# Patient Record
Sex: Female | Born: 1960 | Race: White | Hispanic: No | Marital: Single | Smoking: Current every day smoker
Health system: Southern US, Community
[De-identification: ages and names within clinical notes are randomized; demographics above are authoritative.]

## PROBLEM LIST (undated history)

## (undated) DIAGNOSIS — I671 Cerebral aneurysm, nonruptured: Secondary | ICD-10-CM

## (undated) DIAGNOSIS — I219 Acute myocardial infarction, unspecified: Secondary | ICD-10-CM

## (undated) DIAGNOSIS — I10 Essential (primary) hypertension: Secondary | ICD-10-CM

## (undated) DIAGNOSIS — I2510 Atherosclerotic heart disease of native coronary artery without angina pectoris: Secondary | ICD-10-CM

---

## 2016-12-15 ENCOUNTER — Emergency Department (HOSPITAL_COMMUNITY): Payer: Medicaid Other

## 2016-12-15 ENCOUNTER — Encounter (HOSPITAL_COMMUNITY): Payer: Self-pay | Admitting: *Deleted

## 2016-12-15 ENCOUNTER — Emergency Department (HOSPITAL_COMMUNITY)
Admission: EM | Admit: 2016-12-15 | Discharge: 2016-12-15 | Disposition: A | Discharge status: Home / Self Care | Payer: Medicaid Other | Attending: Emergency Medicine | Admitting: Emergency Medicine

## 2016-12-15 DIAGNOSIS — Y733 Surgical instruments, materials and gastroenterology and urology devices (including sutures) associated with adverse incidents: Secondary | ICD-10-CM | POA: Insufficient documentation

## 2016-12-15 DIAGNOSIS — T83510A Infection and inflammatory reaction due to cystostomy catheter, initial encounter: Secondary | ICD-10-CM | POA: Diagnosis not present

## 2016-12-15 DIAGNOSIS — Z79899 Other long term (current) drug therapy: Secondary | ICD-10-CM | POA: Insufficient documentation

## 2016-12-15 DIAGNOSIS — N390 Urinary tract infection, site not specified: Secondary | ICD-10-CM

## 2016-12-15 DIAGNOSIS — R0789 Other chest pain: Secondary | ICD-10-CM | POA: Insufficient documentation

## 2016-12-15 DIAGNOSIS — R079 Chest pain, unspecified: Secondary | ICD-10-CM | POA: Diagnosis present

## 2016-12-15 DIAGNOSIS — F17200 Nicotine dependence, unspecified, uncomplicated: Secondary | ICD-10-CM | POA: Diagnosis not present

## 2016-12-15 DIAGNOSIS — Z7982 Long term (current) use of aspirin: Secondary | ICD-10-CM | POA: Diagnosis not present

## 2016-12-15 DIAGNOSIS — I252 Old myocardial infarction: Secondary | ICD-10-CM | POA: Diagnosis not present

## 2016-12-15 DIAGNOSIS — I2510 Atherosclerotic heart disease of native coronary artery without angina pectoris: Secondary | ICD-10-CM | POA: Diagnosis not present

## 2016-12-15 DIAGNOSIS — E0590 Thyrotoxicosis, unspecified without thyrotoxic crisis or storm: Secondary | ICD-10-CM

## 2016-12-15 DIAGNOSIS — G894 Chronic pain syndrome: Secondary | ICD-10-CM | POA: Diagnosis not present

## 2016-12-15 DIAGNOSIS — I10 Essential (primary) hypertension: Secondary | ICD-10-CM | POA: Insufficient documentation

## 2016-12-15 DIAGNOSIS — R109 Unspecified abdominal pain: Secondary | ICD-10-CM | POA: Diagnosis not present

## 2016-12-15 DIAGNOSIS — E039 Hypothyroidism, unspecified: Secondary | ICD-10-CM | POA: Diagnosis not present

## 2016-12-15 HISTORY — DX: Atherosclerotic heart disease of native coronary artery without angina pectoris: I25.10

## 2016-12-15 HISTORY — DX: Essential (primary) hypertension: I10

## 2016-12-15 HISTORY — DX: Cerebral aneurysm, nonruptured: I67.1

## 2016-12-15 HISTORY — DX: Acute myocardial infarction, unspecified: I21.9

## 2016-12-15 LAB — URINALYSIS, ROUTINE W REFLEX MICROSCOPIC
Bilirubin Urine: NEGATIVE
Glucose, UA: NEGATIVE mg/dL
Ketones, ur: NEGATIVE mg/dL
Nitrite: POSITIVE — AB
SPECIFIC GRAVITY, URINE: 1.023 (ref 1.005–1.030)
Squamous Epithelial / LPF: NONE SEEN
pH: 8 (ref 5.0–8.0)

## 2016-12-15 LAB — CBC
HCT: 45.2 % (ref 36.0–46.0)
HEMOGLOBIN: 15.4 g/dL — AB (ref 12.0–15.0)
MCH: 31.8 pg (ref 26.0–34.0)
MCHC: 34.1 g/dL (ref 30.0–36.0)
MCV: 93.2 fL (ref 78.0–100.0)
PLATELETS: 218 10*3/uL (ref 150–400)
RBC: 4.85 MIL/uL (ref 3.87–5.11)
RDW: 13.6 % (ref 11.5–15.5)
WBC: 9.4 10*3/uL (ref 4.0–10.5)

## 2016-12-15 LAB — TROPONIN I: Troponin I: <0.03 ng/mL (ref <0.03)

## 2016-12-15 LAB — BASIC METABOLIC PANEL
ANION GAP: 11 (ref 5–15)
BUN: 8 mg/dL (ref 6–20)
CALCIUM: 9.3 mg/dL (ref 8.9–10.3)
CO2: 18 mmol/L — AB (ref 22–32)
CREATININE: 0.78 mg/dL (ref 0.44–1.00)
Chloride: 109 mmol/L (ref 101–111)
GFR calc Af Amer: >60 mL/min (ref >60)
GLUCOSE: 85 mg/dL (ref 65–99)
Potassium: 3.8 mmol/L (ref 3.5–5.1)
Sodium: 138 mmol/L (ref 135–145)

## 2016-12-15 LAB — HEPATIC FUNCTION PANEL
ALT: 12 U/L — ABNORMAL LOW (ref 14–54)
AST: 15 U/L (ref 15–41)
Albumin: 3.8 g/dL (ref 3.5–5.0)
Alkaline Phosphatase: 56 U/L (ref 38–126)
BILIRUBIN TOTAL: 0.5 mg/dL (ref 0.3–1.2)
Total Protein: 6.4 g/dL — ABNORMAL LOW (ref 6.5–8.1)

## 2016-12-15 LAB — I-STAT TROPONIN, ED: TROPONIN I, POC: 0.01 ng/mL (ref 0.00–0.08)

## 2016-12-15 LAB — LIPASE, BLOOD: Lipase: 16 U/L (ref 11–51)

## 2016-12-15 LAB — T4, FREE: Free T4: 1.52 ng/dL — ABNORMAL HIGH (ref 0.61–1.12)

## 2016-12-15 LAB — TSH: TSH: 0.029 u[IU]/mL — AB (ref 0.350–4.500)

## 2016-12-15 MED ORDER — SULFAMETHOXAZOLE-TRIMETHOPRIM 800-160 MG PO TABS: 1.0000 | ORAL_TABLET | Freq: Two times a day (BID) | ORAL | 0 refills | Status: AC

## 2016-12-15 MED ORDER — HYDROMORPHONE HCL 2 MG/ML IJ SOLN
1.0000 mg | Freq: Once | INTRAMUSCULAR | Status: AC
  Administered 2016-12-15: 1 mg via INTRAVENOUS
  Filled 2016-12-15: qty 1

## 2016-12-15 MED ORDER — IOPAMIDOL (ISOVUE-300) INJECTION 61%
INTRAVENOUS | Status: AC
  Administered 2016-12-15: 100 mL
  Filled 2016-12-15: qty 100

## 2016-12-15 MED ORDER — SODIUM CHLORIDE 0.9 % IV BOLUS (SEPSIS)
1000.0000 mL | Freq: Once | INTRAVENOUS | Status: AC
  Administered 2016-12-15: 1000 mL via INTRAVENOUS

## 2016-12-15 MED ORDER — MORPHINE SULFATE (PF) 4 MG/ML IV SOLN
4.0000 mg | Freq: Once | INTRAVENOUS | Status: AC
  Administered 2016-12-15: 4 mg via INTRAVENOUS
  Filled 2016-12-15: qty 1

## 2016-12-15 MED ORDER — LEVOTHYROXINE SODIUM 50 MCG PO TABS: 50.0000 ug | ORAL_TABLET | Freq: Every day | ORAL | 0 refills | Status: AC

## 2016-12-15 MED ORDER — HYDROMORPHONE HCL 2 MG/ML IJ SOLN
1.0000 mg | Freq: Once | INTRAMUSCULAR | Status: AC
  Administered 2016-12-15: 0.5 mg via INTRAVENOUS
  Filled 2016-12-15: qty 1

## 2016-12-15 MED ORDER — OXYCODONE-ACETAMINOPHEN 10-325 MG PO TABS: 1.0000 | ORAL_TABLET | ORAL | 0 refills | Status: AC | PRN

## 2016-12-15 NOTE — Discharge Instructions (Signed)
You are hyperthyroid. Decrease synthroid to 50 mcg daily. Recheck TSH with your doctor in a week.   You have a mild UTI and foley was replaced. Take bactrim twice daily for a week.   You have chronic pain. We have increased your percocet to 10/325 mg.   Follow up with Dr. Leanor KailBonsu.   Return to ER if you have severe chest pain, catheter not draining, severe abdominal pain, vomiting, fevers.

## 2016-12-15 NOTE — ED Notes (Signed)
Pt reports she has weight over the past month.

## 2016-12-15 NOTE — ED Provider Notes (Signed)
[facility]PT Provider Note   CSN: [id number] Arrival date & time: 12/15/16  1658     History   Chief Complaint Chief Complaint  Patient presents with  . Chest Pain    HPI Shannon Montes is a 56 y.o. female hx of brain aneurysm s/p craniotomy and stent, CAD s/p stents, HTN, suprapubic catheter (she had incontinence after aneursym rupture), Here presenting with chest pain, pain around the suprapubic catheter. Patient recently [place] here [place] town to be with her daughter. Patient states that her suprapubic catheter was changed about a week ago right before Christmas. He said that she was not given a leg bag so the catheter has been tugging so there is some blood in her urine for the last 3 days. Patient also noticed that for the last 3 days, she has left-sided chest pain that is constant. Denies any shortness of breath. She states that this is different than her previous heart attacks. Over the last month or so, patient has lost about 40 pounds. She states that she is still eating but has less appetite and has no vomiting. Denies any fevers or chills.      The history is provided by the patient.    Past Medical History:  Diagnosis Date  . Brain aneurysm   . Coronary artery disease   . Hypertension   . MI (myocardial infarction)     There are no active problems to display for this patient.   History reviewed. No pertinent surgical history.  OB History    No data available       Home Medications    Prior to Admission medications   Medication Sig Start Date End Date Taking? Authorizing Provider  albuterol (PROVENTIL) (2.5 MG/3ML) 0.083% nebulizer solution Take 2.5 mg by nebulization every 6 (six) hours as needed for wheezing or shortness of breath.   Yes Historical Provider, MD  albuterol-ipratropium (COMBIVENT) 18-103 MCG/ACT inhaler Inhale 1-2 puffs into the lungs every 4 (four) hours as needed for wheezing or shortness of breath.   Yes Historical Provider, MD    aspirin EC 81 MG tablet Take 81 mg by mouth daily.   Yes Historical Provider, MD  atorvastatin (LIPITOR) 80 MG tablet Take 80 mg by mouth at bedtime.   Yes Historical Provider, MD  clopidogrel (PLAVIX) 75 MG tablet Take 75 mg by mouth daily. 12/07/16  Yes Historical Provider, MD  cyanocobalamin (,VITAMIN B-12,) 1000 MCG/ML injection Inject 1 mL into the muscle every 14 (fourteen) days.  12/11/16  Yes Historical Provider, MD  escitalopram (LEXAPRO) 20 MG tablet Take 20 mg by mouth daily. 12/11/16  Yes Historical Provider, MD  levETIRAcetam (KEPPRA) 1000 MG tablet Take 1,000 mg by mouth 2 (two) times daily.   Yes Historical Provider, MD  levothyroxine (SYNTHROID, LEVOTHROID) 75 MCG tablet Take 75 mcg by mouth daily before breakfast.   Yes Historical Provider, MD  lisinopril (PRINIVIL,ZESTRIL) 10 MG tablet Take 10 mg by mouth daily. 12/07/16  Yes Historical Provider, MD  oxybutynin (DITROPAN) 5 MG tablet Take 5 mg by mouth 2 (two) times daily.   Yes Historical Provider, MD  oxyCODONE-acetaminophen (PERCOCET/ROXICET) 5-325 MG tablet Take 1 tablet by mouth every 8 (eight) hours as needed for pain. 12/07/16  Yes Historical Provider, MD  pantoprazole (PROTONIX) 40 MG tablet Take 40 mg by mouth daily.   Yes Historical Provider, MD  potassium chloride SA (K-DUR,KLOR-CON) 20 MEQ tablet Take 20 mEq by mouth daily.   Yes Historical Provider, MD  topiramate (TOPAMAX) 50 MG tablet Take 50 mg by mouth 2 (two) times daily. 12/11/16  Yes Historical Provider, MD  zolpidem (AMBIEN) 5 MG tablet Take 5 mg by mouth at bedtime as needed for sleep. 12/11/16  Yes Historical Provider, MD    Family History No family history on file.  Social History Social History  Substance Use Topics  . Smoking status: Current Every Day Smoker  . Smokeless tobacco: Never Used  . Alcohol use Yes     Allergies   Beta adrenergic blockers   Review of Systems Review of Systems  Cardiovascular: Positive for chest pain.   Genitourinary:       Blood in urine   All other systems reviewed and are negative.    Physical Exam Updated Vital Signs BP (!) 125/110 (BP Location: Right Arm)   Pulse 61   Temp 98.3 F (36.8 C) (Oral)   Resp 20   SpO2 97%   Physical Exam  Constitutional:  Chronically ill, previous R craniotomy with skin flap   HENT:  Head: Normocephalic.  Eyes: EOM are normal. Pupils are equal, round, and reactive to light.  Neck: Normal range of motion. Neck supple.  Cardiovascular: Normal rate, regular rhythm and normal heart sounds.   Pulmonary/Chest: Effort normal and breath sounds normal. No respiratory distress. She has no wheezes. She has no rales.  Mild reproducible L chest tenderness   Abdominal: Soft. Bowel sounds are normal.  + L sided tenderness. Suprapubic tube in place and is draining urine with minimal clots   Musculoskeletal: Normal range of motion.  Neurological: She is alert.  Skin: Skin is warm.  Psychiatric: She has a normal mood and affect.  Nursing note and vitals reviewed.    ED Treatments / Results  Labs (all labs ordered are listed, but only abnormal results are displayed) Labs Reviewed  BASIC METABOLIC PANEL - Abnormal; Notable for the following:       Result Value   CO2 18 (*)    All other components within normal limits  CBC - Abnormal; Notable for the following:    Hemoglobin 15.4 (*)    All other components within normal limits  URINE CULTURE  TROPONIN I  LIPASE, BLOOD  HEPATIC FUNCTION PANEL  TSH  URINALYSIS, ROUTINE W REFLEX MICROSCOPIC    EKG  EKG Interpretation  Date/Time:  Monday December 15 2016 17:21:42 EST Ventricular Rate:  64 PR Interval:  142 QRS Duration: 82 QT Interval:  418 QTC Calculation: 431 R Axis:   61 Text Interpretation:  Normal sinus rhythm Right atrial enlargement Possible Anterior infarct , age undetermined Abnormal ECG No previous ECGs available Confirmed by Anh Mangano  MD, Amrit Cress ([id number]) on 12/15/2016 6:05:00 PM        Radiology No results found.  Procedures SUPRAPUBIC TUBE PLACEMENT Date/Time: 12/15/2016 11:00 PM Performed by: Charlynne Pander Authorized by: Charlynne Pander   Consent:    Consent obtained:  Verbal   Consent given by:  Patient   Risks discussed:  Bleeding   Alternatives discussed:  No treatment Anesthesia (see MAR for exact dosages):    Anesthesia method:  None Procedure details:    Complexity:  Simple   Catheter type:  Foley   Catheter size:  20 Fr   Ultrasound guidance: no     Number of attempts:  1   Urine characteristics:  Blood-tinged Post-procedure details:    Patient tolerance of procedure:  Tolerated well, no immediate complications   (including critical care time)  Medications Ordered in ED Medications  sodium chloride 0.9 % bolus 1,000 mL (not administered)  morphine 4 MG/ML injection 4 mg (not administered)     Initial Impression / Assessment and Plan / ED Course  I have reviewed the triage vital signs and the nursing notes.  Pertinent labs & imaging results that were available during my care of the patient were reviewed by me and considered in my medical decision making (see chart for details).  Clinical Course     Shannon Montes is a 56 y.o. female here with chest pain, weight loss, suprapubic catheter with blood in it. Chest pain is reproducible but she has hx of CAD with stents so will get trop x 2. Weight loss can be from cancer vs poor appetite vs thyroid problems so will get CT ab/pel, TSH, labs. I will change out the suprapubic catheter as I think there are some blood inside the catheter from not being taped to her leg.   10:59 PM I replaced suprapubic tube. UA from old tube showed blood , ? Colonization vs UTI. Daughter states that the urine smells bad so will give bactrim empirically. Urine culture sent. CXR and CT ab/pel unremarkable. TSH 0.029 and T4 1.5. She is on synthroid and likely is too hyperthyroid causing her weight loss. Will  decrease synthroid to 50 mcg daily. Delta trop neg. Patient actually admits to chronic pain and has been prescribed morphine in the past and now on percocet but ran out since she moved here. Will prescribe percocet for her pain. She has no follow up so I called case management to set up follow up for her. Gave strict return precautions.    Final Clinical Impressions(s) / ED Diagnoses   Final diagnoses:  None    New Prescriptions New Prescriptions   No medications on file     Charlynne Panderavid Hsienta Coralyn Roselli, MD 12/15/16 2303

## 2016-12-15 NOTE — Care Management (Signed)
ED CM consulted concerning assisting patient and family with follow up. Patient has complex medical history and recently relocated to the area. Patient has [place] Medicaid. Patient given resource for [facility] MD in the area accepting new Medicaid patients.

## 2016-12-15 NOTE — ED Triage Notes (Signed)
The pt is c/o lt sided chest pain for 3 days with   Her heart fluttering with bleeding from her supra pubic catheter

## 2016-12-15 NOTE — ED Notes (Signed)
Called X-ray to let them know she was ready for transport

## 2016-12-17 LAB — URINE CULTURE

## 2017-01-29 ENCOUNTER — Emergency Department (HOSPITAL_COMMUNITY)
Admission: EM | Admit: 2017-01-29 | Discharge: 2017-01-30 | Disposition: A | Discharge status: Home / Self Care | Payer: Medicaid Other | Attending: Emergency Medicine | Admitting: Emergency Medicine

## 2017-01-29 ENCOUNTER — Emergency Department (HOSPITAL_COMMUNITY): Payer: Medicaid Other

## 2017-01-29 ENCOUNTER — Encounter (HOSPITAL_COMMUNITY): Payer: Self-pay

## 2017-01-29 DIAGNOSIS — F17200 Nicotine dependence, unspecified, uncomplicated: Secondary | ICD-10-CM | POA: Diagnosis not present

## 2017-01-29 DIAGNOSIS — Z7982 Long term (current) use of aspirin: Secondary | ICD-10-CM | POA: Insufficient documentation

## 2017-01-29 DIAGNOSIS — I10 Essential (primary) hypertension: Secondary | ICD-10-CM | POA: Insufficient documentation

## 2017-01-29 DIAGNOSIS — T83010A Breakdown (mechanical) of cystostomy catheter, initial encounter: Secondary | ICD-10-CM

## 2017-01-29 DIAGNOSIS — I252 Old myocardial infarction: Secondary | ICD-10-CM | POA: Diagnosis not present

## 2017-01-29 DIAGNOSIS — T83098A Other mechanical complication of other indwelling urethral catheter, initial encounter: Secondary | ICD-10-CM | POA: Diagnosis present

## 2017-01-29 DIAGNOSIS — Y731 Therapeutic (nonsurgical) and rehabilitative gastroenterology and urology devices associated with adverse incidents: Secondary | ICD-10-CM | POA: Diagnosis not present

## 2017-01-29 DIAGNOSIS — Z91040 Latex allergy status: Secondary | ICD-10-CM | POA: Insufficient documentation

## 2017-01-29 DIAGNOSIS — I2510 Atherosclerotic heart disease of native coronary artery without angina pectoris: Secondary | ICD-10-CM | POA: Insufficient documentation

## 2017-01-29 DIAGNOSIS — N3090 Cystitis, unspecified without hematuria: Secondary | ICD-10-CM

## 2017-01-29 LAB — CBC WITH DIFFERENTIAL/PLATELET
BASOS ABS: 0.1 10*3/uL (ref 0.0–0.1)
Basophils Relative: 1 %
EOS PCT: 2 %
Eosinophils Absolute: 0.3 10*3/uL (ref 0.0–0.7)
HEMATOCRIT: 42.1 % (ref 36.0–46.0)
Hemoglobin: 14.2 g/dL (ref 12.0–15.0)
LYMPHS PCT: 40 %
Lymphs Abs: 5.2 10*3/uL — ABNORMAL HIGH (ref 0.7–4.0)
MCH: 31.2 pg (ref 26.0–34.0)
MCHC: 33.7 g/dL (ref 30.0–36.0)
MCV: 92.5 fL (ref 78.0–100.0)
MONO ABS: 0.5 10*3/uL (ref 0.1–1.0)
MONOS PCT: 4 %
NEUTROS ABS: 7.1 10*3/uL (ref 1.7–7.7)
Neutrophils Relative %: 53 %
PLATELETS: 250 10*3/uL (ref 150–400)
RBC: 4.55 MIL/uL (ref 3.87–5.11)
RDW: 14.2 % (ref 11.5–15.5)
WBC: 13.2 10*3/uL — ABNORMAL HIGH (ref 4.0–10.5)

## 2017-01-29 LAB — COMPREHENSIVE METABOLIC PANEL
ALBUMIN: 3.8 g/dL (ref 3.5–5.0)
ALK PHOS: 61 U/L (ref 38–126)
ALT: 14 U/L (ref 14–54)
AST: 17 U/L (ref 15–41)
Anion gap: 12 (ref 5–15)
BILIRUBIN TOTAL: 0.6 mg/dL (ref 0.3–1.2)
BUN: 12 mg/dL (ref 6–20)
CALCIUM: 9 mg/dL (ref 8.9–10.3)
CO2: 17 mmol/L — AB (ref 22–32)
Chloride: 110 mmol/L (ref 101–111)
Creatinine, Ser: 0.86 mg/dL (ref 0.44–1.00)
GFR calc Af Amer: >60 mL/min (ref >60)
GFR calc non Af Amer: >60 mL/min (ref >60)
GLUCOSE: 106 mg/dL — AB (ref 65–99)
Potassium: 3.1 mmol/L — ABNORMAL LOW (ref 3.5–5.1)
SODIUM: 139 mmol/L (ref 135–145)
Total Protein: 6.8 g/dL (ref 6.5–8.1)

## 2017-01-29 LAB — I-STAT CG4 LACTIC ACID, ED
LACTIC ACID, VENOUS: 0.85 mmol/L (ref 0.5–1.9)
Lactic Acid, Venous: 1.69 mmol/L (ref 0.5–1.9)

## 2017-01-29 LAB — URINALYSIS, ROUTINE W REFLEX MICROSCOPIC
Bilirubin Urine: NEGATIVE
Glucose, UA: NEGATIVE mg/dL
Ketones, ur: NEGATIVE mg/dL
Nitrite: NEGATIVE
PROTEIN: 30 mg/dL — AB
SPECIFIC GRAVITY, URINE: 1.008 (ref 1.005–1.030)
pH: 6 (ref 5.0–8.0)

## 2017-01-29 LAB — LIPASE, BLOOD: LIPASE: 24 U/L (ref 11–51)

## 2017-01-29 MED ORDER — CIPROFLOXACIN HCL 500 MG PO TABS
500.0000 mg | ORAL_TABLET | Freq: Once | ORAL | Status: AC
  Administered 2017-01-30: 500 mg via ORAL
  Filled 2017-01-29: qty 1

## 2017-01-29 MED ORDER — HYDROMORPHONE HCL 2 MG/ML IJ SOLN
1.0000 mg | Freq: Once | INTRAMUSCULAR | Status: AC
  Administered 2017-01-29: 1 mg via INTRAVENOUS
  Filled 2017-01-29: qty 1

## 2017-01-29 MED ORDER — CIPROFLOXACIN HCL 500 MG PO TABS: 500.0000 mg | ORAL_TABLET | Freq: Two times a day (BID) | ORAL | 0 refills | Status: AC

## 2017-01-29 MED ORDER — OXYBUTYNIN CHLORIDE 5 MG PO TABS
5.0000 mg | ORAL_TABLET | Freq: Once | ORAL | Status: AC
  Administered 2017-01-29: 5 mg via ORAL
  Filled 2017-01-29: qty 1

## 2017-01-29 NOTE — ED Provider Notes (Signed)
[facility] Provider Note   CSN: [id number] Arrival date & time: 01/29/17  1813     History   Chief Complaint Chief Complaint  Patient presents with  . Other    clogged suprapubic catheter    HPI Shannon Montes is a 56 y.o. female. Patient presents due to a clogged suprapubic catheter. She's had this catheter placed for years. 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 She reports her supra pubic catheter stopped draining this morning. She's had this problem before. She has not had any fever or chills. She complains of mild pain around her catheter site. There is also some skin color changes at that area. She is not currently on antibiotics.  HPI  Past Medical History:  Diagnosis Date  . Brain aneurysm   . Coronary artery disease   . Hypertension   . MI (myocardial infarction)     There are no active problems to display for this patient.   History reviewed. No pertinent surgical history.  OB History    No data available       Home Medications    Prior to Admission medications   Medication Sig Start Date End Date Taking? Authorizing Provider  albuterol (PROVENTIL) (2.5 MG/3ML) 0.083% nebulizer solution Take 2.5 mg by nebulization every 6 (six) hours as needed for wheezing or shortness of breath.   Yes Historical Provider, Shannon Montes  albuterol-ipratropium (COMBIVENT) 18-103 MCG/ACT inhaler Inhale 1-2 puffs into the lungs every 4 (four) hours as needed for wheezing or shortness of breath.   Yes Historical Provider, Shannon Montes  aspirin EC 81 MG tablet Take 81 mg by mouth daily.   Yes Historical Provider, Shannon Montes  atorvastatin (LIPITOR) 80 MG tablet Take 80 mg by mouth at bedtime.   Yes Historical Provider, Shannon Montes  clopidogrel (PLAVIX) 75 MG tablet Take 75 mg by mouth daily. 12/07/16  Yes Historical Provider, Shannon Montes  cyanocobalamin (,VITAMIN B-12,) 1000 MCG/ML injection Inject 1 mL into the muscle every 30 (thirty)  days.  12/11/16  Yes Historical Provider, Shannon Montes  escitalopram (LEXAPRO) 20 MG tablet Take 20 mg by mouth daily. 12/11/16  Yes Historical Provider, Shannon Montes  levETIRAcetam (KEPPRA) 1000 MG tablet Take 1,000 mg by mouth 2 (two) times daily.   Yes Historical Provider, Shannon Montes  levothyroxine (SYNTHROID, LEVOTHROID) 50 MCG tablet Take 1 tablet (50 mcg total) by mouth daily before breakfast. 12/15/16  Yes Charlynne Pander, Shannon Montes  lisinopril (PRINIVIL,ZESTRIL) 10 MG tablet Take 10 mg by mouth daily. 12/07/16  Yes Historical Provider, Shannon Montes  oxybutynin (DITROPAN) 5 MG tablet Take 5 mg by mouth 2 (two) times daily.   Yes Historical Provider, Shannon Montes  oxyCODONE-acetaminophen (PERCOCET) 10-325 MG tablet Take 1 tablet by mouth every 4 (four) hours as needed for pain. 12/15/16  Yes Charlynne Pander, Shannon Montes  pantoprazole (PROTONIX) 40 MG tablet Take 40 mg by mouth See admin instructions. In the morning and may take a second dose of 40 mg later in the day of needed   Yes Historical Provider, Shannon Montes  potassium chloride SA (K-DUR,KLOR-CON) 20 MEQ tablet Take 20 mEq by mouth daily.   Yes Historical Provider, Shannon Montes  PROAIR HFA 108 (90 Base) MCG/ACT inhaler Inhale 2 puffs into the lungs 4 (four) times daily. 01/16/17  Yes Historical Provider, Shannon Montes  topiramate (TOPAMAX) 50 MG tablet Take 50 mg by mouth 2 (two) times daily. 12/11/16  Yes Historical Provider, Shannon Montes  ciprofloxacin (CIPRO) 500 MG tablet Take 1 tablet (500  mg total) by mouth every 12 (twelve) hours. 01/29/17 02/05/17  Shannon BihariJohn Michael Davian Hanshaw, Shannon Montes  NICOTINE STEP 2 14 MG/24HR patch Place 1 patch onto the skin daily. 01/16/17   Historical Provider, Shannon Montes    Family History History reviewed. No pertinent family history.  Social History Social History  Substance Use Topics  . Smoking status: Current Every Day Smoker    Packs/day: 0.50  . Smokeless tobacco: Never Used  . Alcohol use Yes     Allergies   Beta adrenergic blockers; Latex; and Tape   Review of Systems Review of Systems  Constitutional:  Negative for chills and fever.  HENT: Negative for ear pain and sore throat.   Eyes: Negative for pain and visual disturbance.  Respiratory: Negative for cough and shortness of breath.   Cardiovascular: Negative for chest pain and palpitations.  Gastrointestinal: Positive for abdominal pain. Negative for vomiting.  Genitourinary: Positive for decreased urine volume. Negative for dysuria and hematuria.  Musculoskeletal: Negative for arthralgias and back pain.  Skin: Positive for color change. Negative for rash.  Neurological: Negative for seizures and syncope.  All other systems reviewed and are negative.    Physical Exam Updated Vital Signs BP 132/69 (BP Location: Right Arm)   Pulse 63   Temp 98.4 F (36.9 C) (Oral)   Resp 22   Ht 5\' 8"  (1.727 m)   Wt 78.9 kg   SpO2 99%   BMI 26.46 kg/m   Physical Exam  Constitutional: She appears well-developed and well-nourished. No distress.  HENT:  Head: Normocephalic and atraumatic.  Eyes: Conjunctivae are normal.  Neck: Neck supple.  Cardiovascular: Normal rate and regular rhythm.   No murmur heard. Pulmonary/Chest: Effort normal and breath sounds normal. No respiratory distress. She has no wheezes. She has no rales.  Abdominal: Soft. Bowel sounds are normal. She exhibits no distension and no mass. There is tenderness (mild suprapubic tenderness). There is no rebound and no guarding.  Musculoskeletal: She exhibits no edema, tenderness or deformity.  Neurological: She is alert.  Skin: Skin is warm and dry. There is erythema.  Small area of erythema surrounding suprapubic catheter site. Small amount of purulent drainage around catheter.  Psychiatric: She has a normal mood and affect.  Nursing note and vitals reviewed.    ED Treatments / Results  Labs (all labs ordered are listed, but only abnormal results are displayed) Labs Reviewed  COMPREHENSIVE METABOLIC PANEL - Abnormal; Notable for the following:       Result Value    Potassium 3.1 (*)    CO2 17 (*)    Glucose, Bld 106 (*)    All other components within normal limits  CBC WITH DIFFERENTIAL/PLATELET - Abnormal; Notable for the following:    WBC 13.2 (*)    Lymphs Abs 5.2 (*)    All other components within normal limits  URINALYSIS, ROUTINE W REFLEX MICROSCOPIC - Abnormal; Notable for the following:    APPearance CLOUDY (*)    Hgb urine dipstick MODERATE (*)    Protein, ur 30 (*)    Leukocytes, UA MODERATE (*)    Bacteria, UA MANY (*)    Squamous Epithelial / LPF 0-5 (*)    All other components within normal limits  LIPASE, BLOOD  I-STAT CG4 LACTIC ACID, ED  I-STAT CG4 LACTIC ACID, ED    EKG  EKG Interpretation None       Radiology Ct Abdomen Pelvis W Contrast  Result Date: 01/29/2017 CLINICAL DATA:  Lower abdominal pain.  Clogged suprapubic catheter. EXAM: CT ABDOMEN AND PELVIS WITH CONTRAST TECHNIQUE: Multidetector CT imaging of the abdomen and pelvis was performed using the standard protocol following bolus administration of intravenous contrast. COMPARISON:  CT abdomen pelvis 12/15/2016 FINDINGS: Lower chest: No pulmonary nodules. No visible pleural or pericardial effusion. Hepatobiliary: Unchanged cyst in the left hepatic lobe. Liver otherwise normal. Normal gallbladder. Pancreas: Normal pancreatic contours and enhancement. No peripancreatic fluid collection or pancreatic ductal dilatation. Spleen: Unchanged foci of hypoattenuation in the spleen. Adrenals/Urinary Tract: Normal adrenal glands. No hydronephrosis or solid renal mass. There is a suprapubic catheter within the urinary bladder, which is nearly completely decompressed. No focal abnormality along the course of the catheter. Stomach/Bowel: No abnormal bowel dilatation. No bowel wall thickening or adjacent fat stranding to indicate acute inflammation. No abdominal fluid collection. Normal appendix. Vascular/Lymphatic: There is atherosclerotic calcification of the non aneurysmal abdominal  aorta. No abdominal or pelvic adenopathy. Reproductive: Status post hysterectomy.  No adnexal mass. Musculoskeletal: No lytic or blastic osseous lesion. Normal visualized extrathoracic and extraperitoneal soft tissues. Other: No contributory non-categorized findings. IMPRESSION: 1. No focal abnormality along the course of the reportedly clogged suprapubic catheter. The urinary bladder is decompressed and there is no hydronephrosis. 2. Aortic atherosclerosis. 3. No acute abnormality of the abdomen or pelvis. Electronically Signed   By: Deatra Robinson M.D.   On: 01/29/2017 21:20    Procedures Procedures (including critical care time)  Medications Ordered in ED Medications  HYDROmorphone (DILAUDID) injection 1 mg (1 mg Intravenous Given 01/29/17 1942)  oxybutynin (DITROPAN) tablet 5 mg (5 mg Oral Given 01/29/17 2242)  HYDROmorphone (DILAUDID) injection 1 mg (1 mg Intravenous Given 01/29/17 2223)  ciprofloxacin (CIPRO) tablet 500 mg (500 mg Oral Given 01/30/17 0018)     Initial Impression / Assessment and Plan / ED Course  I have reviewed the triage vital signs and the nursing notes.  Pertinent labs & imaging results that were available during my care of the patient were reviewed by me and considered in my medical decision making (see chart for details).    Pt is a pleasant 56 yo female with hx as above who present with a clogged suprapubic catheter. She has had the catheter in place for years d/t neurogenic bladder from prior brain aneurysms and nec fasc requiring multiple surgeries. Her catheter stopped draining earlier today. She has had purulent drainage around catheter site and is leaking urine from urethra. Exam notable for lower abd tenderness as well as mild erythema around catheter site. Labs notable for leukocytosis and hypokalemia. Pt is already on potassium replacement. CT scan obtained to r/o abscess. CT showed no acute findings to account for pt's pain. UA shows evidence of infection. I  replaced the suprapubic catheter with same size (20 Fr) catheter. Pt tolerated procedure well and the new catheter is draining well. Pt reports her pain has improved. Plan is to treat UTI with Cipro for 7 days. She has a urologist in [place] where she thinks she will be returning. Advised to f/u with current urologist or if she stays in this area to f/u with our folks. Pt discharged in stable condition. Ambulating w/o diffiuculty. Return precautions discussed in detail.  Final Clinical Impressions(s) / ED Diagnoses   Final diagnoses:  Cystitis  Suprapubic catheter dysfunction, initial encounter [facility])    New Prescriptions Discharge Medication List as of 01/29/2017 11:59 PM    START taking these medications   Details  ciprofloxacin (CIPRO) 500 MG tablet Take 1 tablet (500 mg  total) by mouth every 12 (twelve) hours., Starting Thu 01/29/2017, Until Thu 02/05/2017, Print         Shannon Bihari, Shannon Montes 01/30/17 1258    Canary Brim Tegeler, Shannon Montes 01/31/17 [phone]

## 2017-01-29 NOTE — ED Notes (Signed)
EDP aware of pt pain

## 2017-01-29 NOTE — ED Notes (Signed)
EDP at bedside  

## 2017-01-29 NOTE — ED Notes (Signed)
In and out cath only produced 50ml of urine, pt was bladder scanned and only 37ml in bladder. EDP also at bedside

## 2017-01-29 NOTE — ED Triage Notes (Addendum)
Pt endorses clogged suprapubic catheter, last output in bag was yesterday. Pt endorses lower abdominal discomfort. VSS. Green drainage noted around catheter tube at insertion site.

## 2017-01-29 NOTE — ED Notes (Signed)
In and out cath at bedside. Awaiting on order for same.

## 2018-09-09 IMAGING — CT CT ABD-PELV W/ CM
2 of 6 series · 16 of 46 positions shown, 18 images · IV contrast (APPLIED)
Comparison: CT abdomen pelvis 12/15/2016

CLINICAL DATA: Lower abdominal pain.  Clogged suprapubic catheter.

EXAM:
CT ABDOMEN AND PELVIS WITH CONTRAST
TECHNIQUE: Multidetector CT imaging of the abdomen and pelvis was performed
using the standard protocol following bolus administration of
intravenous contrast.

[Series 2: abd/ pelvis 5.0 i30f 1 · axial · 0.86mm/px · z∈[+806,+1196]mm · 13 of 88 slices shown, 15 images]
[im 5/88  soft-tissue]
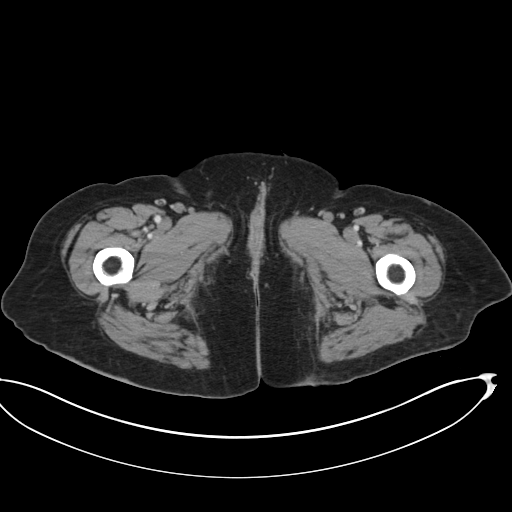
[im 5/88  bone]
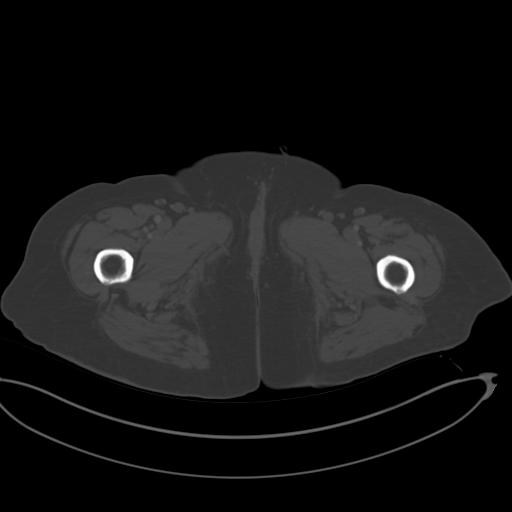
[im 10/88  soft-tissue]
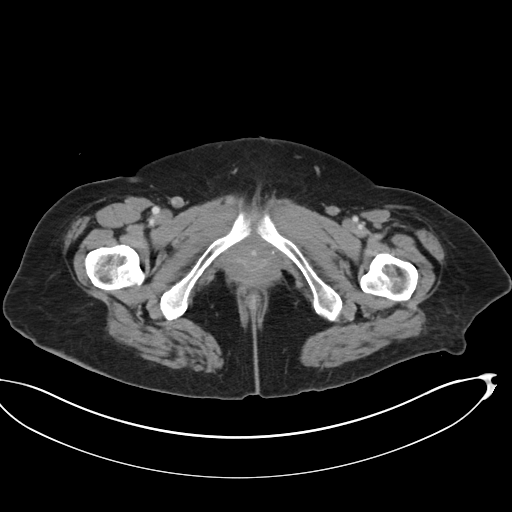
[im 20/88  soft-tissue]
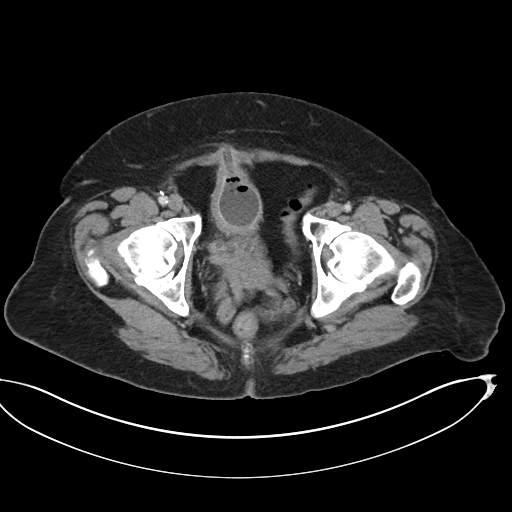
[im 25/88  soft-tissue]
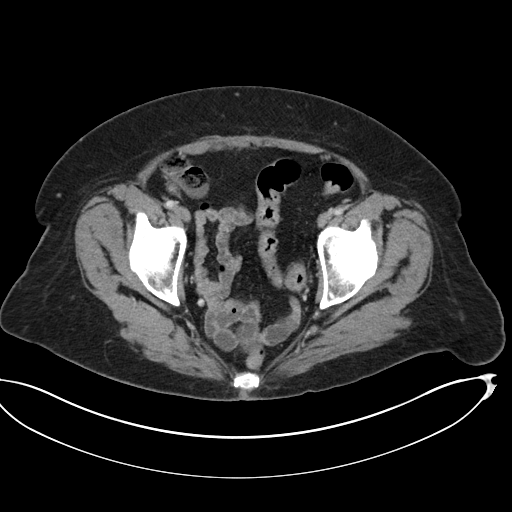
[im 30/88  soft-tissue]
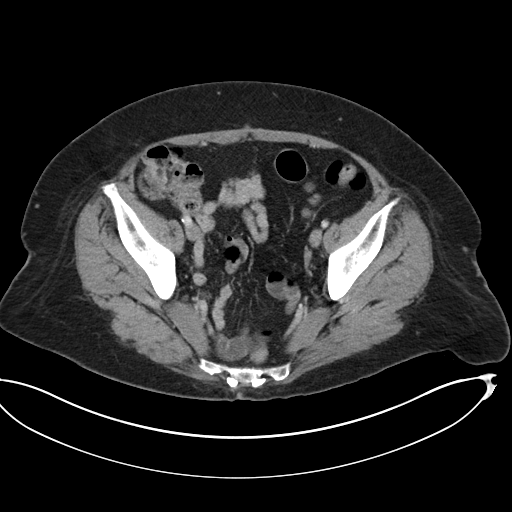
[im 39/88  soft-tissue]
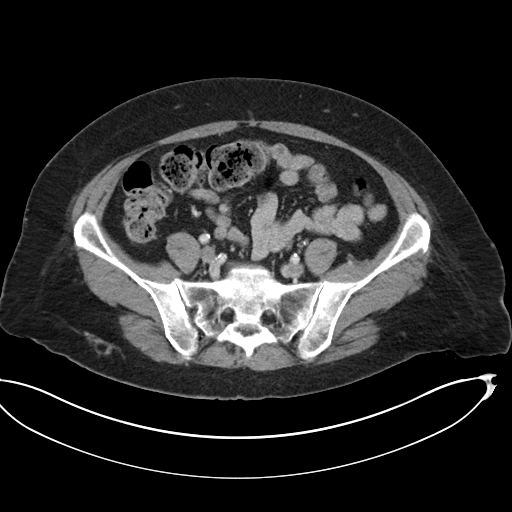
[im 44/88  soft-tissue]
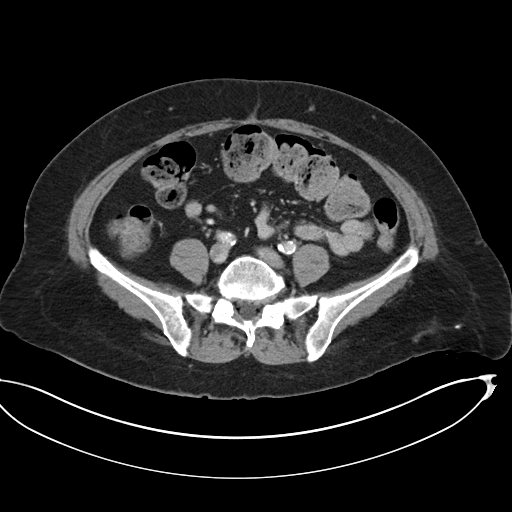
[im 49/88  soft-tissue]
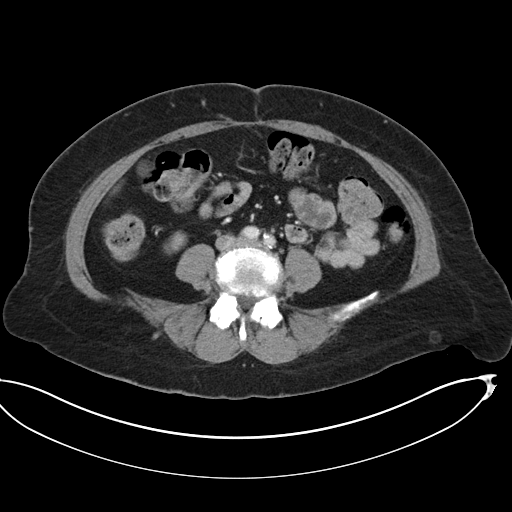
[im 59/88  soft-tissue]
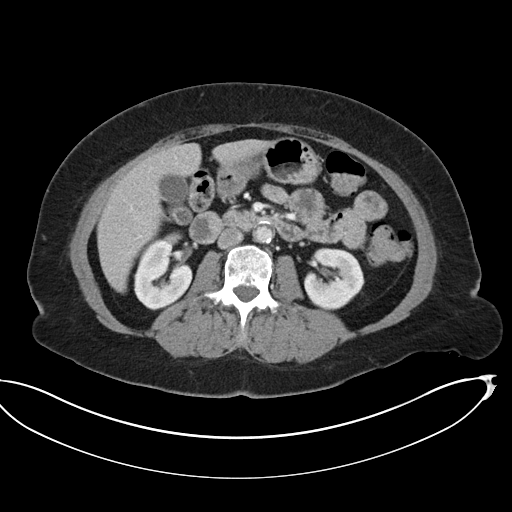
[im 59/88  bone]
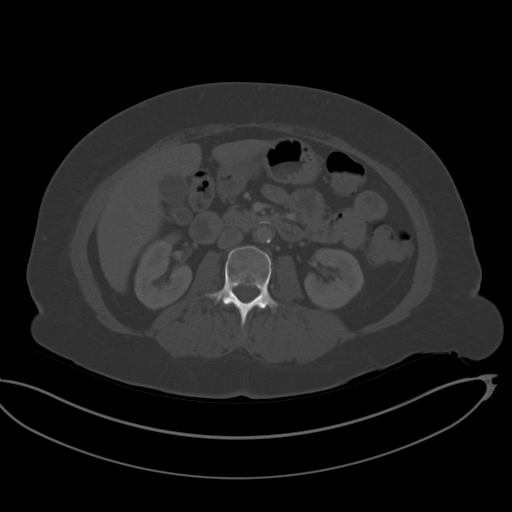
[im 63/88  soft-tissue]
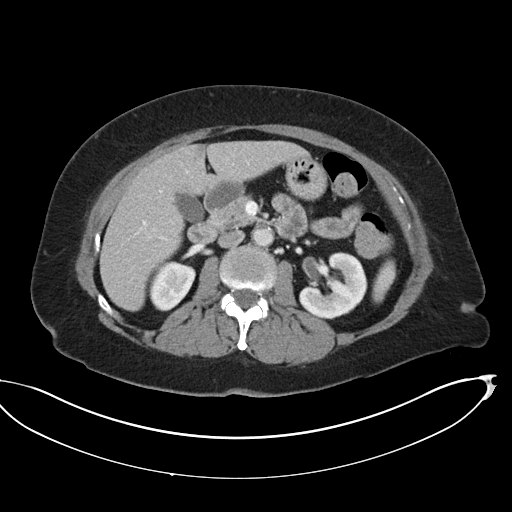
[im 68/88  soft-tissue]
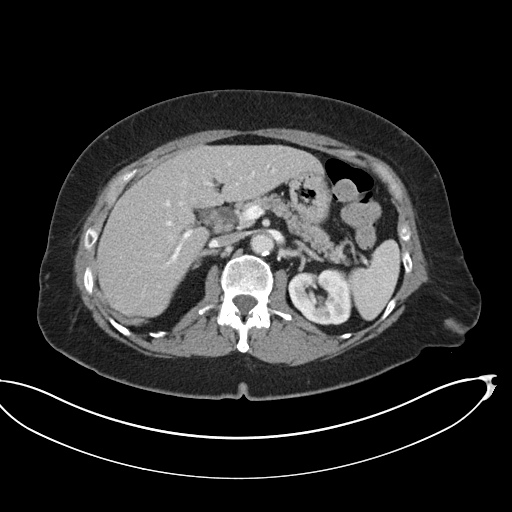
[im 78/88  soft-tissue]
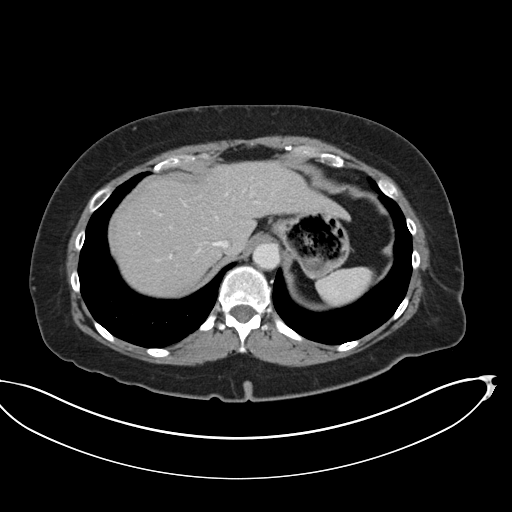
[im 83/88  soft-tissue]
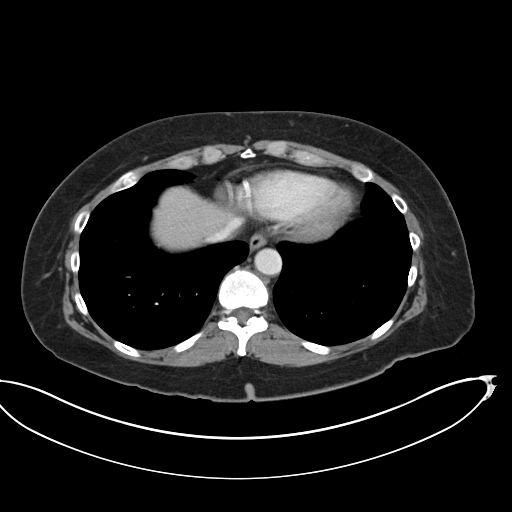

[Series 5: coronal soft tissue · coronal · 0.86mm/px · 3 of 100 slices shown]
[im 34/100  soft-tissue]
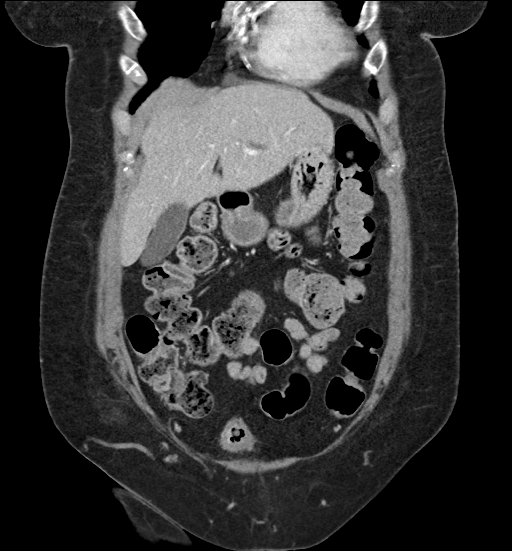
[im 45/100  soft-tissue]
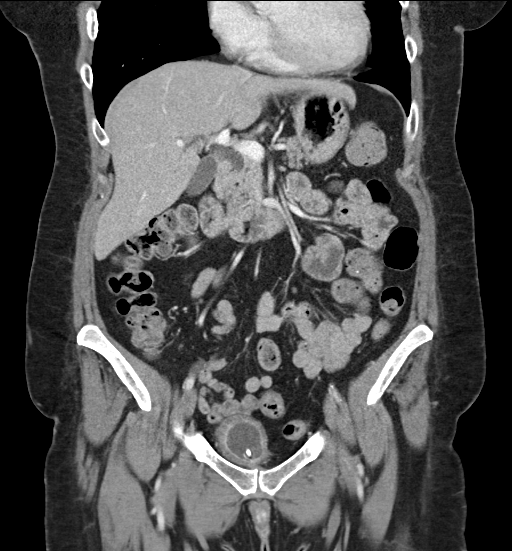
[im 56/100  soft-tissue]
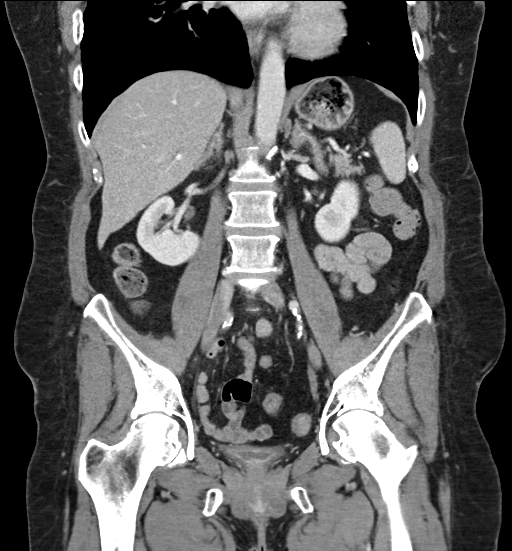

[16 of 46 positions shown; findings below may reference images not displayed]

FINDINGS: Lower chest: No pulmonary nodules. No visible pleural or pericardial
effusion.

Hepatobiliary: Unchanged cyst in the left hepatic lobe. Liver
otherwise normal. Normal gallbladder.

Pancreas: Normal pancreatic contours and enhancement. No
peripancreatic fluid collection or pancreatic ductal dilatation.

Spleen: Unchanged foci of hypoattenuation in the spleen.

Adrenals/Urinary Tract: Normal adrenal glands. No hydronephrosis or
solid renal mass. There is a suprapubic catheter within the urinary
bladder, which is nearly completely decompressed. No focal
abnormality along the course of the catheter.

Stomach/Bowel: No abnormal bowel dilatation. No bowel wall
thickening or adjacent fat stranding to indicate acute inflammation.
No abdominal fluid collection. Normal appendix.

Vascular/Lymphatic: There is atherosclerotic calcification of the
non aneurysmal abdominal aorta. No abdominal or pelvic adenopathy.

Reproductive: Status post hysterectomy.  No adnexal mass.

Musculoskeletal: No lytic or blastic osseous lesion. Normal
visualized extrathoracic and extraperitoneal soft tissues.

Other: No contributory non-categorized findings.
IMPRESSION: 1. No focal abnormality along the course of the reportedly clogged
suprapubic catheter. The urinary bladder is decompressed and there
is no hydronephrosis.
2. Aortic atherosclerosis.
3. No acute abnormality of the abdomen or pelvis.
